# Patient Record
Sex: Male | Born: 1990 | Marital: Single | Smoking: Never smoker
Health system: Southern US, Community
[De-identification: ages and names within clinical notes are randomized; demographics above are authoritative.]

---
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
## 2008-06-02 ENCOUNTER — Ambulatory Visit: Payer: Self-pay | Admitting: Internal Medicine

## 2008-06-03 ENCOUNTER — Encounter (INDEPENDENT_AMBULATORY_CARE_PROVIDER_SITE_OTHER): Payer: Self-pay | Admitting: *Deleted

## 2008-07-28 ENCOUNTER — Ambulatory Visit: Payer: Self-pay | Admitting: Internal Medicine

## 2008-07-28 DIAGNOSIS — M25579 Pain in unspecified ankle and joints of unspecified foot: Secondary | ICD-10-CM | POA: Insufficient documentation

## 2008-07-29 ENCOUNTER — Telehealth (INDEPENDENT_AMBULATORY_CARE_PROVIDER_SITE_OTHER): Payer: Self-pay | Admitting: *Deleted

## 2008-07-29 LAB — CONVERTED CEMR LAB
ALT: 39 units/L (ref 0–53)
AST: 22 units/L (ref 0–37)
Alkaline Phosphatase: 53 units/L (ref 39–117)
Basophils Absolute: 0.1 10*3/uL (ref 0.0–0.1)
Calcium: 9.8 mg/dL (ref 8.4–10.5)
Chloride: 103 meq/L (ref 96–112)
Creatinine, Ser: 1 mg/dL (ref 0.4–1.5)
GFR calc non Af Amer: 103 mL/min
HCT: 42 % (ref 39.0–52.0)
Lymphocytes Relative: 23.6 % (ref 12.0–46.0)
MCHC: 34.7 g/dL (ref 30.0–36.0)
Monocytes Absolute: 0.8 10*3/uL (ref 0.1–1.0)
Monocytes Relative: 7.1 % (ref 3.0–12.0)
Neutrophils Relative %: 67 % (ref 43.0–77.0)
Platelets: 272 10*3/uL (ref 150–400)
Potassium: 3.8 meq/L (ref 3.5–5.1)
RDW: 12.6 % (ref 11.5–14.6)
Sodium: 142 meq/L (ref 135–145)
Total Protein: 8.1 g/dL (ref 6.0–8.3)
Uric Acid, Serum: 9.9 mg/dL — ABNORMAL HIGH (ref 4.0–7.8)

## 2011-03-09 NOTE — Assessment & Plan Note (Signed)
[facility]                          [place] OFFICE NOTE   NAME:Trevino, Jacob                       MRN:          [id number]  DATE:06/18/2007                            DOB:          1991/01/24    HISTORY AND PHYSICAL:   CHIEF COMPLAINT:  Rash.   HISTORY OF PRESENT ILLNESS:  Jacob Trevino is a 20 year old boy, I believe  originally from [place], who came here because of rash.  The rash is at his  face, neck, chest and back.  It is itchy at times.  He has used some over-  the-counter medication without any help.   PAST MEDICAL HISTORY:  Negative.   FAMILY HISTORY:  The patient and his mother denied any history of diabetes,  heart attacks, or cancer.   SOCIAL HISTORY:  He is a high Ec[employer] doing very well. Does not smoke  or drink.   REVIEW OF SYSTEMS:  He also states that from time to time he has pain in the  chest.  He points to the left pectoral area.  The pain is random, non-  exertional, lasts a few seconds, is not associated with any other symptoms,  and is somehow different when he is stretched.   MEDICATIONS:  None.   ALLERGIES:  No known drug allergies.   PHYSICAL EXAMINATION:  GENERAL:  He is alert, oriented in no apparent  distress.  VITAL SIGNS:  He is 5 feet 8 1/4 inch tall.  He weighs 193 pounds.  Blood  pressure 108/76.  Pulse 68.  SKIN:  The rash is more noticeable today at the anterior neck, upper chest  and upper back.  There are several lesions, most of them are pink to skin  color, papular ones.  He does have some classic white dots and black dots in  the face.  He states that many of the lesions in the back started that way  as white dots in the back and chest.   PROBLEM:  1. Jacob Trevino seems to have acne in the back and chest.  I went ahead and      prescribed metronidazole lotion, to be mixed with benzoyl peroxide to      use twice a day in all of the affected areas.  2. The patient has an atypical  chest pain.  I ask him to come back for a      followup in four weeks      and at that time we will do a more complete physical exam and follow up      on the rash.  The patient and the mom agreed to that plan.                                   Willow Ora, MD   JP/MedQ  DD:  06/17/2006  DT:  06/17/2006  Job #:  [id number]

## 2016-12-26 ENCOUNTER — Encounter: Payer: Self-pay | Admitting: Podiatry

## 2016-12-26 ENCOUNTER — Ambulatory Visit (INDEPENDENT_AMBULATORY_CARE_PROVIDER_SITE_OTHER): Payer: BLUE CROSS/BLUE SHIELD | Admitting: Podiatry

## 2016-12-26 VITALS — BP 131/87 | HR 71 | Ht 69.0 in | Wt 233.0 lb

## 2016-12-26 DIAGNOSIS — M65979 Unspecified synovitis and tenosynovitis, unspecified ankle and foot: Secondary | ICD-10-CM

## 2016-12-26 DIAGNOSIS — M659 Synovitis and tenosynovitis, unspecified: Secondary | ICD-10-CM | POA: Diagnosis not present

## 2016-12-26 DIAGNOSIS — M21969 Unspecified acquired deformity of unspecified lower leg: Secondary | ICD-10-CM | POA: Diagnosis not present

## 2016-12-26 DIAGNOSIS — M216X9 Other acquired deformities of unspecified foot: Secondary | ICD-10-CM

## 2016-12-26 DIAGNOSIS — M1000 Idiopathic gout, unspecified site: Secondary | ICD-10-CM

## 2016-12-26 NOTE — Progress Notes (Signed)
SUBJECTIVE: 26 y.o. year old male presents accompanied by his mother stating that he hurt his right foot after doing exercise while he was in [place]. Pain is at lateral ankle at this time. Initial injury was at the lateral column of left foot. He has noted a new tender spot with redness above right lateral ankle. He was [place] a doctor while in [place]. Was taking Vorem (Difluconac), Tagamet 200gm, Solaxin that were prescribed by [facility] doctor,  Had had high Uric Acid with Tophus formation. He used to take Urinom to lower blood uric acid.   REVIEW OF SYSTEMS: A comprehensive review of systems was negative except for: Gout.  OBJECTIVE: DERMATOLOGIC EXAMINATION: Mild erythema without edema over skin surface proximal to lateral malleoli with mild tenderness at certain spot with pressure.  VASCULAR EXAMINATION OF LOWER LIMBS: All pedal pulses are palpable with normal pulsation.  Capillary Filling times within 3 seconds in all digits.  No edema or erythema noted at affected area. Temperature gradient from tibial crest to dorsum of foot is within normal bilateral.  NEUROLOGIC EXAMINATION OF THE LOWER LIMBS: All epicritic and tactile sensations grossly intact. Sharp and Dull discriminatory sensations at the plantar ball of hallux is intact bilateral.   MUSCULOSKELETAL EXAMINATION: Positive of firm palpable mass at dorsum over 5th MPJ area extending to 4th metatarsal head area, about 3 x 4 cm.  Positive for hypermobile first ray bilateral. STJ hyperpronation bilateral.  RADIOGRAPHIC STUDIES:  General overview: Increased radio opacity surrounding 5th MPJ area of left foot.  Lateral view:  Significant elevation of the first ray bilateral. No acute changes seen at affected right lateral column.   ASSESSMENT: Hypermobile first ray bilateral. Abnormal weight shifting to lateral column. R/O Insect bite with cellular reaction superior to lateral malleoli. 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  PLAN: Reviewed findings and available treatment options. Discussed continuing medication to lower uric acid. May benefit from Custom orthotics. May remove left foot mass if requested. Return as needed.

## 2016-12-26 NOTE — Patient Instructions (Signed)
Seen for pain in right foot. Noted of excess first ray motion with lateral weight shifting. May have had lesser metatarsalgia from excess weight shifting during exercise. May continue with previous prescription if needed. New area of redness and tenderness over lateral ankle on right foot may have caused by an insect bite and resulted in cellular reaction. Use Cortisone cream if needed to reduce swelling or redness. Also noted of possible Tophus mass over 5th metatarsal head area left foot.  Return if need to have surgically excised. Discussed the need for custom orthotics. Will call with insurance coverage info.

## 2016-12-27 ENCOUNTER — Encounter: Payer: Self-pay | Admitting: Podiatry

## 2017-01-03 ENCOUNTER — Ambulatory Visit (INDEPENDENT_AMBULATORY_CARE_PROVIDER_SITE_OTHER): Payer: BLUE CROSS/BLUE SHIELD | Admitting: Podiatry

## 2017-01-03 DIAGNOSIS — M65979 Unspecified synovitis and tenosynovitis, unspecified ankle and foot: Secondary | ICD-10-CM

## 2017-01-03 DIAGNOSIS — M21969 Unspecified acquired deformity of unspecified lower leg: Secondary | ICD-10-CM

## 2017-01-03 DIAGNOSIS — M25579 Pain in unspecified ankle and joints of unspecified foot: Secondary | ICD-10-CM | POA: Diagnosis not present

## 2017-01-03 DIAGNOSIS — M659 Synovitis and tenosynovitis, unspecified: Secondary | ICD-10-CM | POA: Diagnosis not present

## 2017-01-03 DIAGNOSIS — M216X1 Other acquired deformities of right foot: Secondary | ICD-10-CM

## 2017-01-03 DIAGNOSIS — R2242 Localized swelling, mass and lump, left lower limb: Secondary | ICD-10-CM | POA: Diagnosis not present

## 2017-01-03 DIAGNOSIS — M25872 Other specified joint disorders, left ankle and foot: Secondary | ICD-10-CM

## 2017-01-03 DIAGNOSIS — M216X2 Other acquired deformities of left foot: Secondary | ICD-10-CM | POA: Diagnosis not present

## 2017-01-03 NOTE — Patient Instructions (Signed)
Both feet casted for Orthotics. As per request, surgical excision of left foot lesion approximately 3x5 cm mass under skin reviewed and signed. Will discuss further on surgery date.

## 2017-01-03 NOTE — Progress Notes (Signed)
Duration of 3 weeks since original injury.   SUBJECTIVE: 26 y.o. year old male presents accompanied by his mother requesting for custom orthotics and pre op consult for excision of the left foot lesion.  He still has some tenderness and swelling over the right foot at injured site.  HPI: Injury on right foot and was treated by a doctor while visiting [place]. Was taking Vorem (Difluconac), Tagamet 200gm, Solaxin that were prescribed by [id number] doctor.  Had had high Uric Acid with Tophus formation. He used to take Urinom to lower blood uric acid.   OBJECTIVE: DERMATOLOGIC EXAMINATION: No edema or erythema noted on right foot.  VASCULAR EXAMINATION OF LOWER LIMBS: All pedal pulses are palpable with normal pulsation.  Capillary Filling times within 3 seconds in all digits.  No edema or erythema noted at affected area. Temperature gradient from tibial crest to dorsum of foot is within normal bilateral.  NEUROLOGIC EXAMINATION OF THE LOWER LIMBS: All epicritic and tactile sensations grossly intact. Sharp and Dull discriminatory sensations at the plantar ball of hallux is intact bilateral.   MUSCULOSKELETAL EXAMINATION: Positive of firm palpable mass at dorsum over 5th MPJ area extending to 4th metatarsal head area, about 3 x 4 cm.  Positive for hypermobile first ray bilateral. STJ hyperpronation bilateral.  ASSESSMENT: Hypermobile first ray bilateral. Abnormal weight shifting to lateral column. R/O Insect bite with cellular reaction superior to lateral malleoli. Frequent history of Gout with Tophus formation left forefoot.  PLAN: Reviewed findings and available treatment options. Both feet casted for Orthotics. Pre op consult done for excision of benign mass over left foot near 4th and 5th MPJ.

## 2017-01-04 ENCOUNTER — Encounter: Payer: Self-pay | Admitting: Podiatry

## 2017-01-24 ENCOUNTER — Other Ambulatory Visit: Payer: Self-pay | Admitting: Podiatry

## 2017-01-24 MED ORDER — HYDROCODONE-IBUPROFEN 7.5-200 MG PO TABS: 1.0000 | ORAL_TABLET | Freq: Four times a day (QID) | ORAL | 0 refills | Status: DC | PRN

## 2017-01-25 DIAGNOSIS — L929 Granulomatous disorder of the skin and subcutaneous tissue, unspecified: Secondary | ICD-10-CM | POA: Diagnosis not present

## 2017-01-25 DIAGNOSIS — D2370 Other benign neoplasm of skin of unspecified lower limb, including hip: Secondary | ICD-10-CM | POA: Diagnosis not present

## 2017-01-25 DIAGNOSIS — D492 Neoplasm of unspecified behavior of bone, soft tissue, and skin: Secondary | ICD-10-CM | POA: Diagnosis not present

## 2017-01-25 HISTORY — PX: PRE-MALIGNANT / BENIGN SKIN LESION EXCISION: SHX160

## 2017-01-28 ENCOUNTER — Encounter: Payer: Self-pay | Admitting: *Deleted

## 2017-01-28 ENCOUNTER — Ambulatory Visit (INDEPENDENT_AMBULATORY_CARE_PROVIDER_SITE_OTHER): Payer: BLUE CROSS/BLUE SHIELD | Admitting: Podiatry

## 2017-01-28 DIAGNOSIS — M109 Gout, unspecified: Secondary | ICD-10-CM | POA: Insufficient documentation

## 2017-01-28 DIAGNOSIS — Z9889 Other specified postprocedural states: Secondary | ICD-10-CM

## 2017-01-28 MED ORDER — INDOMETHACIN 50 MG PO CAPS: 50.0000 mg | ORAL_CAPSULE | Freq: Three times a day (TID) | ORAL | 1 refills | Status: DC

## 2017-01-28 MED ORDER — AMOXICILLIN-POT CLAVULANATE 875-125 MG PO TABS: 1.0000 | ORAL_TABLET | Freq: Two times a day (BID) | ORAL | 0 refills | Status: DC

## 2017-01-28 NOTE — Patient Instructions (Signed)
Post op visit to remove drain. Noted of excess swelling with active drainage. Take antibiotics and Indocin as prescribed. Return this Thursday. If swelling and pain is increasing, return sooner.

## 2017-01-28 NOTE — Progress Notes (Signed)
3 days post op visit to remove drainage placed at the surgical wound (01/25/17). Patient stated that he was having excess pain. Wound had bled through surgical bandage. Left foot is warm with mild erythema. Wound has serous drainage from the drainage opening. Residual fluid pressed out. Cleansed with Iodine. Compression bandage applied.

## 2017-01-29 ENCOUNTER — Ambulatory Visit (INDEPENDENT_AMBULATORY_CARE_PROVIDER_SITE_OTHER): Payer: BLUE CROSS/BLUE SHIELD | Admitting: Podiatry

## 2017-01-29 DIAGNOSIS — Z9889 Other specified postprocedural states: Secondary | ICD-10-CM

## 2017-01-30 ENCOUNTER — Encounter: Payer: Self-pay | Admitting: Podiatry

## 2017-01-30 NOTE — Patient Instructions (Signed)
Post surgical wound healing is normal with subsiding edema and erythema. Surgical incision area flushed with antibiotic solution. Return in 3 days.

## 2017-01-30 NOTE — Progress Notes (Signed)
4 days post op visit. 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 Patient was asked to return today to have the surgical wound irrigated with antibiotic solution, Gentamycin and normal saline. Stated that the foot feels much better.   Objective: Reduced edema and erythema noted. No drainage from the wound noted. Wound is well closed.  Assessment: Subsiding edema and inflammation with good wound healing.  Plan: Surgical site cleansed and surface incision site flushed well with Antibiotic solution. Wet to dry dressing applied. Continue antibiotics (Augmentin 875/125) and Return in 3 days.

## 2017-01-31 ENCOUNTER — Encounter: Payer: BLUE CROSS/BLUE SHIELD | Admitting: Podiatry

## 2017-02-05 ENCOUNTER — Ambulatory Visit (INDEPENDENT_AMBULATORY_CARE_PROVIDER_SITE_OTHER): Payer: BLUE CROSS/BLUE SHIELD | Admitting: Podiatry

## 2017-02-05 ENCOUNTER — Encounter: Payer: Self-pay | Admitting: Podiatry

## 2017-02-05 DIAGNOSIS — Z9889 Other specified postprocedural states: Secondary | ICD-10-CM

## 2017-02-05 NOTE — Patient Instructions (Signed)
10 Days post op wound healing normal. Sutures removed and Mefix tape placed with instruction. Return next Wednesday.

## 2017-02-05 NOTE — Progress Notes (Signed)
10 days post op visit. 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 Been back to Gout medication. Denies any problem other than back of left heel hurts from leaning back.   Objective: Clean dry surgical site. Wound is well closed.  Assessment: Normal wound healing in progress.   Plan: Sutures removed. Return in one week.

## 2017-02-13 ENCOUNTER — Encounter: Payer: Self-pay | Admitting: Podiatry

## 2017-02-13 ENCOUNTER — Ambulatory Visit (INDEPENDENT_AMBULATORY_CARE_PROVIDER_SITE_OTHER): Payer: BLUE CROSS/BLUE SHIELD | Admitting: Podiatry

## 2017-02-13 DIAGNOSIS — Z9889 Other specified postprocedural states: Secondary | ICD-10-CM

## 2017-02-13 NOTE — Progress Notes (Signed)
19days post op visit.  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 Been back to Gout medication. Had developed mild rash over the left foot possible from compression bandage.  Objective: Good wound closure without edema or erythema.  Assessment: Normal wound healing in progress.   Plan: Continue with Mefix tape to reinforce the incision site for the next couple of weeks. Home care supply dispensed. Return as needed.

## 2017-02-13 NOTE — Patient Instructions (Addendum)
19 days post op follow up left foot. Wound healing normal. Continue with Mefix tape for the next 2 weeks. Return as needed.

## 2017-04-01 ENCOUNTER — Encounter: Payer: Self-pay | Admitting: *Deleted

## 2017-07-12 ENCOUNTER — Ambulatory Visit (INDEPENDENT_AMBULATORY_CARE_PROVIDER_SITE_OTHER): Payer: BLUE CROSS/BLUE SHIELD | Admitting: Family Medicine

## 2017-07-12 ENCOUNTER — Encounter: Payer: Self-pay | Admitting: Family Medicine

## 2017-07-12 VITALS — BP 129/81 | HR 57 | Temp 98.2°F | Resp 18 | Ht 69.0 in | Wt 235.8 lb

## 2017-07-12 DIAGNOSIS — M25471 Effusion, right ankle: Secondary | ICD-10-CM | POA: Diagnosis not present

## 2017-07-12 DIAGNOSIS — M25571 Pain in right ankle and joints of right foot: Secondary | ICD-10-CM | POA: Diagnosis not present

## 2017-07-12 DIAGNOSIS — M79604 Pain in right leg: Secondary | ICD-10-CM | POA: Diagnosis not present

## 2017-07-12 DIAGNOSIS — S93401A Sprain of unspecified ligament of right ankle, initial encounter: Secondary | ICD-10-CM

## 2017-07-12 DIAGNOSIS — Z8739 Personal history of other diseases of the musculoskeletal system and connective tissue: Secondary | ICD-10-CM | POA: Diagnosis not present

## 2017-07-12 MED ORDER — MELOXICAM 7.5 MG PO TABS: 7.5000 mg | ORAL_TABLET | Freq: Every day | ORAL | 0 refills | Status: DC

## 2017-07-12 NOTE — Progress Notes (Signed)
Subjective:    Patient ID: Jacob Trevino, male    DOB: [birth year], 26 y.o.   MRN: [id number]  HPI Jacob Trevino is a 26 y.o. male Presents today for: Chief Complaint  Patient presents with  . 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  Today presents with right leg pain. Started with popping in R ankle 4 days ago.  Has continued to try to pop it last few days, and feels like more sore. Had a misstep 2 days ago when picking up sticks. Ankle turned in at that time. Took indomethacin a few times. Was able to weight bear, walking ok. Some more soreness with walking by end of day yesterday.tried ibuprofen - min relief.  Noticed more pain and swelling last night. Bandaged last night, but soreness worsened. More radiating pain last night - radiated up leg to R buttock area - affected sleep at 4am. Tried herbal oil like Tiger balm this am. Min relief. Tried hydrocodone (leftover from prior surgery) about 5 am - helped some.  Less soreness today. Able to walk and scribe earlier today. Had been using inserts in shoes, but taken out today. Less radiating pain up leg this am.   No bowel or bladder incontinence, no saddle anesthesia, no lower extremity weakness.    Last gout flare unknown. Current pain feels different than prior gout pain.   Tx.: ace bandage and elevation last night.    Patient Active Problem List   Diagnosis Date Noted  . Gout 01/28/2017  . ANKLE PAIN, LEFT 07/28/2008   History reviewed. No pertinent past medical history. Past Surgical History:  Procedure Laterality Date  . PRE-MALIGNANT / BENIGN SKIN LESION EXCISION Left 01/25/2017   Left Foot   No Known Allergies Prior to Admission medications   Medication Sig Start Date End Date Taking? Authorizing Provider  HYDROcodone-ibuprofen (VICOPROFEN) 7.5-200 MG tablet Take 1 tablet by mouth every 6 (six) hours as needed for moderate pain. 01/24/17  Yes  Sheard, Myeong O, DPM  indomethacin (INDOCIN) 50 MG capsule Take 1 capsule (50 mg total) by mouth 3 (three) times daily with meals. Take three times a day till pain and swelling stops. Then take twice a day for 3 more days. 01/28/17  Yes Sheard, Briscoe Burns, DPM   Social History   Social History  . Marital status: Single    Spouse name: N/A  . Number of children: N/A  . Years of education: N/A   Occupational History  . Not on file.   Social History Main Topics  . Smoking status: Never Smoker  . Smokeless tobacco: Never Used  . Alcohol use Not on file  . Drug use: Unknown  . Sexual activity: Not on file   Other Topics Concern  . Not on file   Social History Narrative  . No narrative on file    Review of Systems  Constitutional: Negative for chills and fever.  Musculoskeletal: Positive for arthralgias and joint swelling. Negative for back pain (minimal lower yesterday. ).  Skin: Negative for rash.  other per HPI.      Objective:   Physical Exam  Constitutional: He is oriented to person, place, and time. He appears well-developed and well-nourished. No distress.  Cardiovascular: Normal rate.   Pulmonary/Chest: Effort normal.  Musculoskeletal:       Right ankle: He exhibits normal range of motion, no ecchymosis and normal pulse. Tenderness (  Soft tissue lateral ankle, and slightly tender at retrocalcaneal bursa. No focal bony tenderness.). AITFL tenderness found. No lateral malleolus, no medial malleolus, no CF ligament, no posterior TFL, no head of 5th metatarsal and no proximal fibula tenderness found. Achilles tendon normal.       Lumbar back: He exhibits no tenderness and no bony tenderness.       Right foot: There is normal range of motion, no tenderness and no bony tenderness.  Negative seated straight leg raise.  Neurological: He is alert and oriented to person, place, and time.  NVI distally  Skin: Skin is warm and dry. No rash noted. No erythema.  Psychiatric: He has a  normal mood and affect. His behavior is normal.   Vitals:   07/12/17 1244  BP: 129/81  Pulse: (!) 57  Resp: 18  Temp: 98.2 F (36.8 C)  TempSrc: Oral  SpO2: 97%  Weight: 235 lb 12.8 oz (107 kg)  Height: 5\' 9"  (1.753 m)      Assessment & Plan:    [name] is a 26 y.o. male Right ankle swelling - Plan: meloxicam (MOBIC) 7.5 MG tablet, Apply ASO ankle  Acute right ankle pain - Plan: meloxicam (MOBIC) 7.5 MG tablet, Apply ASO ankle  Right leg pain - Plan: meloxicam (MOBIC) 7.5 MG tablet  History of gout - Plan: meloxicam (MOBIC) 7.5 MG tablet  Sprain of right ankle, unspecified ligament, initial encounter  History of gout, possible mild gout flare with ankle pain/slight swelling. However also has history of sprain. Walking ankle, may have changed position, gait, progression of leg pain versus early sciatica. Overall reassuring exam without apparent need for x-ray at present.  -Start meloxicam 7.5 mg daily, ASO brace placed on ankle, handout given. Relative rest, elevation, RTC precautions given  Meds ordered this encounter  Medications  . meloxicam (MOBIC) 7.5 MG tablet    Sig: Take 1 tablet (7.5 mg total) by mouth daily.    Dispense:  30 tablet    Refill:  0   Patient Instructions    Try either the indomethacin up to 3 times per day with food OR the meloxicam once per day. Do not combine those medications. ankle brace as needed, ice and elevation, see other information on ankle sprains below. Gentle range of motion initially. I think you may have a combination of possible swelling from gout versus just an ankle sprain. Leg pain may be related to walking differently.  I expect that to continue to improve as meloxicam or indomethacin should also help leg pain. If those areas are not improving into next week, follow up with me we can decide if x-rays are needed   Return to the clinic or go to the nearest emergency room if any of your symptoms worsen or new symptoms  occur.  Ankle Sprain An ankle sprain is a stretch or tear in one of the tough, fiber-like tissues (ligaments) in the ankle. The ligaments in your ankle help to hold the bones of the ankle together. What are the causes? This condition is often caused by stepping on or falling on the outer edge of the foot. What increases the risk? This condition is more likely to develop in people who play sports. What are the signs or symptoms? Symptoms of this condition include:  Pain in your ankle.  Swelling.  Bruising. Bruising may develop right after you sprain your ankle or 1-2 days later.  Trouble standing or walking, especially when you turn or change directions.  How is this diagnosed? This condition is diagnosed with a physical exam. During the exam, your health care provider will press on certain parts of your foot and ankle and try to move them in certain ways. X-rays may be taken to see how severe the sprain is and to check for broken bones. How is this treated? This condition may be treated with:  A brace. This is used to keep the ankle from moving until it heals.  An elastic bandage. This is used to support the ankle.  Crutches.  Pain medicine.  Surgery. This may be needed if the sprain is severe.  Physical therapy. This may help to improve the range of motion in the ankle.  Follow these instructions at home:  Rest your ankle.  Take over-the-counter and prescription medicines only as told by your health care provider.  For 2-3 days, keep your ankle raised (elevated) above the level of your heart as much as possible.  If directed, apply ice to the area: ? Put ice in a plastic bag. ? Place a towel between your skin and the bag. ? Leave the ice on for 20 minutes, 2-3 times a day.  If you were given a brace: ? Wear it as directed. ? Remove it to shower or bathe. ? Try not to move your ankle much, but wiggle your toes from time to time. This helps to prevent  swelling.  If you were given an elastic bandage (dressing): ? Remove it to shower or bathe. ? Try not to move your ankle much, but wiggle your toes from time to time. This helps to prevent swelling. ? Adjust the dressing to make it more comfortable if it feels too tight. ? Loosen the dressing if you have numbness or tingling in your foot, or if your foot becomes cold and blue.  If you have crutches, use them as told by your health care provider. Continue to use them until you can walk without feeling pain in your ankle. Contact a health care provider if:  You have rapidly increasing bruising or swelling.  Your pain is not relieved with medicine. Get help right away if:  Your toes or foot becomes numb or blue.  You have severe pain that gets worse. This information is not intended to replace advice given to you by your health care provider. Make sure you discuss any questions you have with your health care provider. Document Released: 10/08/2005 Document Revised: 02/15/2016 Document Reviewed: 05/10/2015 Elsevier Interactive Patient Education  2017 [employer].   IF you received an x-ray today, you will receive an invoice from [facility]. Please contact [facility] at [phone] with questions or concerns regarding your invoice.   IF you received labwork today, you will receive an invoice from [place]. Please contact [facility] at [phone] with questions or concerns regarding your invoice.   Our billing staff will not be able to assist you with questions regarding bills from these companies.  You will be contacted with the lab results as soon as they are available. The fastest way to get your results is to activate your My Chart account. Instructions are located on the last page of this paperwork. If you have not heard from [place] regarding the results in 2 weeks, please contact this office.      Signed,   Merri Ray, MD [facility].  07/14/17 1:10 PM

## 2017-07-12 NOTE — Patient Instructions (Addendum)
Try either the indomethacin up to 3 times per day with food OR the meloxicam once per day. Do not combine those medications. ankle brace as needed, ice and elevation, see other information on ankle sprains below. Gentle range of motion initially. I think you may have a combination of possible swelling from gout versus just an ankle sprain. Leg pain may be related to walking differently.  I expect that to continue to improve as meloxicam or indomethacin should also help leg pain. If those areas are not improving into next week, follow up with me we can decide if x-rays are needed   Return to the clinic or go to the nearest emergency room if any of your symptoms worsen or new symptoms occur.  Ankle Sprain An ankle sprain is a stretch or tear in one of the tough, fiber-like tissues (ligaments) in the ankle. The ligaments in your ankle help to hold the bones of the ankle together. What are the causes? This condition is often caused by stepping on or falling on the outer edge of the foot. What increases the risk? This condition is more likely to develop in people who play sports. What are the signs or symptoms? Symptoms of this condition include:  Pain in your ankle.  Swelling.  Bruising. Bruising may develop right after you sprain your ankle or 1-2 days later.  Trouble standing or walking, especially when you turn or change directions.  How is this diagnosed? This condition is diagnosed with a physical exam. During the exam, your health care provider will press on certain parts of your foot and ankle and try to move them in certain ways. X-rays may be taken to see how severe the sprain is and to check for broken bones. How is this treated? This condition may be treated with:  A brace. This is used to keep the ankle from moving until it heals.  An elastic bandage. This is used to support the ankle.  Crutches.  Pain medicine.  Surgery. This may be needed if the sprain is  severe.  Physical therapy. This may help to improve the range of motion in the ankle.  Follow these instructions at home:  Rest your ankle.  Take over-the-counter and prescription medicines only as told by your health care provider.  For 2-3 days, keep your ankle raised (elevated) above the level of your heart as much as possible.  If directed, apply ice to the area: ? Put ice in a plastic bag. ? Place a towel between your skin and the bag. ? Leave the ice on for 20 minutes, 2-3 times a day.  If you were given a brace: ? Wear it as directed. ? Remove it to shower or bathe. ? Try not to move your ankle much, but wiggle your toes from time to time. This helps to prevent swelling.  If you were given an elastic bandage (dressing): ? Remove it to shower or bathe. ? Try not to move your ankle much, but wiggle your toes from time to time. This helps to prevent swelling. ? Adjust the dressing to make it more comfortable if it feels too tight. ? Loosen the dressing if you have numbness or tingling in your foot, or if your foot becomes cold and blue.  If you have crutches, use them as told by your health care provider. Continue to use them until you can walk without feeling pain in your ankle. Contact a health care provider if:  You have rapidly increasing bruising  or swelling.  Your pain is not relieved with medicine. Get help right away if:  Your toes or foot becomes numb or blue.  You have severe pain that gets worse. This information is not intended to replace advice given to you by your health care provider. Make sure you discuss any questions you have with your health care provider. Document Released: 10/08/2005 Document Revised: 02/15/2016 Document Reviewed: 05/10/2015 Elsevier Interactive Patient Education  2017 [employer].   IF you received an x-ray today, you will receive an invoice from [facility]. Please contact [facility] at [phone] with  questions or concerns regarding your invoice.   IF you received labwork today, you will receive an invoice from [facility]. Please contact [facility] at [phone] with questions or concerns regarding your invoice.   Our billing staff will not be able to assist you with questions regarding bills from these companies.  You will be contacted with the lab results as soon as they are available. The fastest way to get your results is to activate your My Chart account. Instructions are located on the last page of this paperwork. If you have not heard from [place] regarding the results in 2 weeks, please contact this office.

## 2017-08-08 ENCOUNTER — Other Ambulatory Visit: Payer: Self-pay | Admitting: Family Medicine

## 2017-08-08 DIAGNOSIS — M79604 Pain in right leg: Secondary | ICD-10-CM

## 2017-08-08 DIAGNOSIS — M25571 Pain in right ankle and joints of right foot: Secondary | ICD-10-CM

## 2017-08-08 DIAGNOSIS — M25471 Effusion, right ankle: Secondary | ICD-10-CM

## 2017-08-08 DIAGNOSIS — Z8739 Personal history of other diseases of the musculoskeletal system and connective tissue: Secondary | ICD-10-CM

## 2017-11-13 ENCOUNTER — Ambulatory Visit (INDEPENDENT_AMBULATORY_CARE_PROVIDER_SITE_OTHER): Payer: BLUE CROSS/BLUE SHIELD | Admitting: Family Medicine

## 2017-11-13 ENCOUNTER — Encounter: Payer: Self-pay | Admitting: Family Medicine

## 2017-11-13 ENCOUNTER — Ambulatory Visit (INDEPENDENT_AMBULATORY_CARE_PROVIDER_SITE_OTHER): Payer: BLUE CROSS/BLUE SHIELD

## 2017-11-13 VITALS — BP 144/93 | HR 65 | Temp 97.4°F | Resp 18 | Ht 69.0 in | Wt 243.2 lb

## 2017-11-13 DIAGNOSIS — M25522 Pain in left elbow: Secondary | ICD-10-CM

## 2017-11-13 DIAGNOSIS — M6588 Other synovitis and tenosynovitis, other site: Secondary | ICD-10-CM | POA: Diagnosis not present

## 2017-11-13 DIAGNOSIS — M65839 Other synovitis and tenosynovitis, unspecified forearm: Secondary | ICD-10-CM

## 2017-11-13 DIAGNOSIS — R030 Elevated blood-pressure reading, without diagnosis of hypertension: Secondary | ICD-10-CM

## 2017-11-13 DIAGNOSIS — Z8739 Personal history of other diseases of the musculoskeletal system and connective tissue: Secondary | ICD-10-CM

## 2017-11-13 DIAGNOSIS — M25532 Pain in left wrist: Secondary | ICD-10-CM | POA: Diagnosis not present

## 2017-11-13 LAB — POCT CBC
GRANULOCYTE PERCENT: 66.7 % (ref 37–80)
HCT, POC: 42.5 % — AB (ref 43.5–53.7)
Hemoglobin: 14.5 g/dL (ref 14.1–18.1)
Lymph, poc: 2.4 (ref 0.6–3.4)
MCH, POC: 30.7 pg (ref 27–31.2)
MCHC: 34.1 g/dL (ref 31.8–35.4)
MCV: 89.9 fL (ref 80–97)
MID (CBC): 0.4 (ref 0–0.9)
MPV: 7 fL (ref 0–99.8)
PLATELET COUNT, POC: 262 10*3/uL (ref 142–424)
POC Granulocyte: 5.6 (ref 2–6.9)
POC LYMPH %: 28.7 % (ref 10–50)
POC MID %: 4.6 %M (ref 0–12)
RBC: 4.73 M/uL (ref 4.69–6.13)
RDW, POC: 12.8 %
WBC: 8.4 10*3/uL (ref 4.6–10.2)

## 2017-11-13 LAB — POCT SEDIMENTATION RATE: POCT SED RATE: 30 mm/h — AB (ref 0–22)

## 2017-11-13 MED ORDER — MELOXICAM 7.5 MG PO TABS: 7.5000 mg | ORAL_TABLET | Freq: Every day | ORAL | 0 refills | Status: DC

## 2017-11-13 MED ORDER — PREDNISONE 20 MG PO TABS: ORAL_TABLET | ORAL | 0 refills | Status: DC

## 2017-11-13 NOTE — Progress Notes (Signed)
Subjective:    Patient ID: Jacob Trevino, male    DOB: [birth year], 27 y.o.   MRN: [id number] Chief Complaint  Patient presents with  . 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  However, 2d prior, he woke from sleep with left wrist pain.  He does have a h/o gout (and has been watching his purine food intake closely w/o changes as well as taking supplements to help lower his uric acid levels. He has never had a gout flair in the upper ext.)  He tried the normal things that he does for gout such as holding the joint under warm water which made his pain sig worse. Icing helped minimally. He tried meloxicam w/o any relief. The pain has proceeded to worsen and is now severely restricting his motion in his hand and fingers as well. Can't really use his left forearm distally at all.  No numbness or tingling. Pain kept him from sleep last night - he had to hold his left arm above his head or adducted across his chest to even make it tolerable.  No rashes, no erythema.  Slight warmth to the area but not the head from gout before.  Not hyperesthetic to light touch.  Slight swelling. No f/c. No systemic sxs. Never h/o any wrist pain/injuries prior. No injury provoking.  Unrelated - for a long time he has had sensitivity of his left olecranon process - whenever he accidentally hits his Lt olecranon against something - it causes severe instantaneous sensitivity - sort of long the "funny bone"/ulnar nerve sensation except it is with the tip of the elbow, not the groove and does not take much pressure. Only lasts a second or two, no radiation. Never happens to right. Left very point of elbow feels a little sharper/more prominent to him than Rt.  No h/o injury.  Not changed over the yrs - just persisting.  No past medical history on file. Past Surgical History:    Procedure Laterality Date  . PRE-MALIGNANT / BENIGN SKIN LESION EXCISION Left 01/25/2017   Left Foot   No current outpatient medications on file prior to visit.   No current facility-administered medications on file prior to visit.    No Known Allergies No family history on file. Social History   Socioeconomic History  . Marital status: Single    Spouse name: None  . Number of children: None  . Years of education: None  . Highest education level: None  Social Needs  . Financial resource strain: None  . Food insecurity - worry: None  . Food insecurity - inability: None  . Transportation needs - medical: None  . Transportation needs - non-medical: None  Occupational History  . None  Tobacco Use  . Smoking status: Never Smoker  . Smokeless tobacco: Never Used  Substance and Sexual Activity  . Alcohol use: None  . Drug use: None  . Sexual activity: None  Other Topics Concern  . None  Social History Narrative  . None   Depression screen [facility] 2/9 11/13/2017 07/12/2017  Decreased Interest 0 0  Down, Depressed, Hopeless 0 0  PHQ - 2 Score 0 0     Review of Systems See hpi    Objective:   Physical Exam  Constitutional: He is oriented to  person, place, and time. He appears well-developed and well-nourished. No distress.  HENT:  Head: Normocephalic and atraumatic.  Eyes: No scleral icterus.  Pulmonary/Chest: Effort normal.  Musculoskeletal:       Right elbow: He exhibits deformity (question of slight bony prominence of olecranon process in Lt >Rt). He exhibits normal range of motion, no swelling and no effusion. No tenderness found.       Right wrist: He exhibits decreased range of motion, tenderness, bony tenderness and swelling (mild over ulnar styloid). He exhibits no effusion, no crepitus and no deformity.       Left wrist: Normal.       Right forearm: Normal. He exhibits no tenderness and no bony tenderness.       Right hand: He exhibits decreased range of motion.  He exhibits no tenderness, no bony tenderness, normal capillary refill and no swelling. Normal sensation noted. Decreased strength noted. He exhibits wrist extension trouble.       Left hand: Normal.  Pain located on dorsum of wrist over ulnar styloid which is very ttp and radiates distally. Ulnar/lateral aspect of carpals very ttp. Mild tenderness over 4th and 5th metacarpals, none over MTPs/fingers. Non over flexor aspect of wrist.  Mild decrease in supination, moderate decrease in pronation.  Left wrist flexion moderately reduced, extension severely reduced. Grasp mildly reduced but slow/stiff and pain prohibits strength. Severe pain with wrist flex/ext resistance. No warmth, skin normal appearance/color, normal cap refill and pulses, mild swelling over ulnar aspect of dorsum of hand.  Neurological: He is alert and oriented to person, place, and time.  Skin: Skin is warm and dry. He is not diaphoretic.  Psychiatric: He has a normal mood and affect. His behavior is normal.    BP (!) 144/93   Pulse 65   Temp (!) 97.4 F (36.3 C) (Oral)   Resp 18   Ht 5\' 9"  (1.753 m)   Wt 243 lb 3.2 oz (110.3 kg)   SpO2 97%   BMI 35.91 kg/m   Dg Wrist Complete Left  Result Date: 11/13/2017 CLINICAL DATA:  Left wrist pain and reduced range of motion for the past 3 days. No known injury. Symptoms are centered over the ulnar aspect of the wrist. Patient has a history of gout. EXAM: LEFT WRIST - COMPLETE 3+ VIEW COMPARISON:  None in PACs FINDINGS: The bones are subjectively adequately mineralized. There no lytic or blastic lesion. There is mild soft tissue swelling over the distal ulna. No soft tissue calcifications are observed. The joint spaces are well maintained. There is no acute fracture or dislocation. IMPRESSION: There is no acute bony abnormality of the left wrist. No erosive changes are observed. There is mild soft tissue swelling over the distal ulna. Electronically Signed   By: David  Martinique M.D.    On: 11/13/2017 09:18   Results for orders placed or performed in visit on 11/13/17             POCT CBC  Result Value Ref Range   WBC 8.4 4.6 - 10.2 K/uL   Lymph, poc 2.4 0.6 - 3.4   POC LYMPH PERCENT 28.7 10 - 50 %L   MID (cbc) 0.4 0 - 0.9   POC MID % 4.6 0 - 12 %M   POC Granulocyte 5.6 2 - 6.9   Granulocyte percent 66.7 37 - 80 %G   RBC 4.73 4.69 - 6.13 M/uL   Hemoglobin 14.5 14.1 - 18.1 g/dL   HCT, POC 42.5 (A)  43.5 - 53.7 %   MCV 89.9 80 - 97 fL   MCH, POC 30.7 27 - 31.2 pg   MCHC 34.1 31.8 - 35.4 g/dL   RDW, POC 12.8 %   Platelet Count, POC 262 142 - 424 K/uL   MPV 7.0 0 - 99.8 fL  POCT SEDIMENTATION RATE  Result Value Ref Range   POCT SED RATE 30 (A) 0 - 22 mm/hr       Assessment & Plan:   1. Acute pain of left wrist   2. History of gout   3. Extensor carpi radialis longus tenosynovitis - placed in cock-up wrist splint. Start prednisone taper today - after complete course than can transition to meloxicam daily until pain resolved. RICE. Wear splint until pain resolved, then can remove brace at rest and sleep but wear during exacerbating activities until completely sx free. Recheck in 1 week if improving but RTC if no improvement or worsening - ok to f/u w/ myself or sports med Dr. Carlota Raspberry.  4. Left elbow pain - mentioned at end of visit and likely unrelated to current pain. Discussed xraying but has already been in an hr - will defer till f/u visit in 1 wk.  5. Elevated blood pressure reading - suspect due to severity of pain, all nml prior     Orders Placed This Encounter  Procedures  . DG Wrist Complete Left    Standing Status:   Future    Number of Occurrences:   1    Standing Expiration Date:   01/12/2019    Order Specific Question:   Reason for Exam (SYMPTOM  OR DIAGNOSIS REQUIRED)    Answer:   left wrist pain over ulner styloid and ulnar aspect of wrist with sig reduced AROM x 3d, NKI, no sig response to nsaids, h/o gout    Order Specific Question:    Preferred imaging location?    Answer:   External    Order Specific Question:   Radiology Contrast Protocol - do NOT remove file path    Answer:   [email or web address]  . Uric acid  . POCT CBC  . POCT SEDIMENTATION RATE    Meds ordered this encounter  Medications  . predniSONE (DELTASONE) 20 MG tablet    Sig: Take 3 tabs po qam x 2d, 2 tabs po qam x 2d, 1 tab po qam x 2d, then stop    Dispense:  12 tablet    Refill:  0  . meloxicam (MOBIC) 7.5 MG tablet    Sig: Take 1-2 tablets (7.5-15 mg total) by mouth daily. Starting after prednisone is complete    Dispense:  60 tablet    Refill:  0    Delman Cheadle, M.D.  Primary Care at [facility] [address] [place], [place] [address] [phone] phone [phone] fax  11/15/17 3:38 PM

## 2017-11-13 NOTE — Patient Instructions (Addendum)
Dg Wrist Complete Left  Result Date: 11/13/2017 CLINICAL DATA:  Left wrist pain and reduced range of motion for the past 3 days. No known injury. Symptoms are centered over the ulnar aspect of the wrist. Patient has a history of gout. EXAM: LEFT WRIST - COMPLETE 3+ VIEW COMPARISON:  None in PACs FINDINGS: The bones are subjectively adequately mineralized. There no lytic or blastic lesion. There is mild soft tissue swelling over the distal ulna. No soft tissue calcifications are observed. The joint spaces are well maintained. There is no acute fracture or dislocation. IMPRESSION: There is no acute bony abnormality of the left wrist. No erosive changes are observed. There is mild soft tissue swelling over the distal ulna. Electronically Signed   By: David  Martinique M.D.   On: 11/13/2017 09:18    Ice for 20 min 4 times a day at least.  Rest. Wear brace until pain is completely resolved, then can remove and wear only when you are doing activities that might exacerbate (like typing). Can restart meloxicam when the prednisone is complete. Recheck w/ sports med Carlota Raspberry) in 1 week.   IF you received an x-ray today, you will receive an invoice from [facility]. Please contact [facility] at [phone] with questions or concerns regarding your invoice.   IF you received labwork today, you will receive an invoice from [facility]. Please contact [facility] at [phone] with questions or concerns regarding your invoice.   Our billing staff will not be able to assist you with questions regarding bills from these companies.  You will be contacted with the lab results as soon as they are available. The fastest way to get your results is to activate your My Chart account. Instructions are located on the last page of this paperwork. If you have not heard from [place] regarding the results in 2 weeks, please contact this office.      Extensor Carpi Ulnaris Tendinitis Extensor carpi ulnaris tendinitis  is inflammation of the long, thin muscle that is located on the outer side of your forearm (extensor carpi ulnaris). It is a common sports-related injury. What are the causes? This condition is caused by:  Putting repeated stress on your wrist.  Using an improper technique while playing sports like golf and tennis.  Weakening of the tendon due to age or a health problem.  What increases the risk? This condition is more likely to develop in people who:  Play sports like golf, tennis, or rugby.  Are middle-aged or older.  Have an inflammatory condition, such asrheumatoid arthritis.  What are the signs or symptoms? Symptoms of this condition include:  Pain along your forearm when moving your wrist.  A constant ache on the pinkie side of your wrist.  Feeling like your grip is weaker than usual.  A popping or tearing feeling in your wrist.  Swelling.  How is this diagnosed? This condition is diagnosed based on your symptoms, your medical history, and the results of a physical exam. During your exam, your health care provider will check the strength of your grip and may ask you to bend your wrist. Your health care provider may also order an MRI or ultrasound to check for tears in your ligaments, muscles, or tendons. How is this treated?  Treatment for this condition includes:  Resting your arm.  Wearing a splint on your wrist.  Medicines to help with pain and swelling.  Applying heat or ice to the area to ease pain.  Physical therapy to strengthen  and restore range of motion in your wrist.  Follow these instructions at home: If you have a splint:  Wear it as told by your health care provider. Remove it only as told by your health care provider.  Loosen the splint if your fingers become numb and tingle, or if they turn cold and blue.  Do not let your splint get wet if it is not waterproof.  Keep the splint clean. Bathing  Do not take baths, swim, or use a hot tub  until your health care provider approves. Ask your health care provider if you can take showers. You may only be allowed to take sponge baths for bathing.  If your splint is not waterproof, cover it with a watertight plastic bag when you take a bath or a shower. Managing pain, stiffness, and swelling  If directed, apply ice to the injured area. ? Put ice in a plastic bag. ? Place a towel between your skin and the bag. ? Leave the ice on for 20 minutes, 2-3 times a day. Activity  Return to your normal activities as told by your health care provider. Ask your health care provider what activities are safe for you.  Avoid activities that put strain on your wrist for as long as told by your health care provider.  Do not use your injured hand to support your body weight until your health care provider says that you can.  Do exercises as told by your health care provider. General instructions  Do not use any tobacco products, including cigarettes, chewing tobacco, or e-cigarettes. Tobacco can delay bone healing. If you need help quitting, ask your health care provider.  Take over-the-counter and prescription medicines only as told by your health care provider.  Keep all follow-up visits as told by your health care provider. This is important. How is this prevented?  Warm up and stretch before being active.  Cool down and stretch after being active.  Give your body time to rest between periods of activity.  Make sure to use equipment that fits you.  If you play golf or racket sports, try to improve your technique or focus on proper form.  Maintain physical fitness, including: ? Strength. ? Flexibility. ? Endurance. Contact a health care provider if:  Your pain does not improve in 7--10 days.  Your pain gets worse. Get help right away if:  Your pain is severe.  You cannot move your wrist. This information is not intended to replace advice given to you by your health care  provider. Make sure you discuss any questions you have with your health care provider. Document Released: 10/08/2005 Document Revised: 06/14/2016 Document Reviewed: 06/24/2015 Elsevier Interactive Patient Education  2018 [employer] and [employer] Radialis Tendinitis What are the causes? These conditions may be caused by:  Repetitive motions or overuse (common).  Wear and tear. (common).  An injury.  Excessive exercise or strain.  Certain antibiotic medicines.  In some cases, the cause may not be known. What increases the risk? These conditions are more likely to develop in:  People who play sports that involve constantly flexing or stretching the wrist and forearm, such as volleyball and water polo.  Older adults.  People with have a job that involves flexing the wrist over and over, such as people who work as Chiropractor, Psychologist, prison and probation services, Personnel officer.  People with certain health conditions, such as: ? Rheumatoid arthritis. ? Gout. ? Diabetes.  What are the signs or  symptoms? Symptoms of these conditions may develop gradually. Symptoms include:  Pain or tenderness in the wrist.  Pain when flexing or stretching the wrist.  Pain when gripping or lifting with the palm of the hand.  Swelling.  How is this diagnosed? This condition may be diagnosed based on:  Your symptoms.  Your medical history.  A physical exam.  During the physical exam, you may be asked to move your hand, wrist, and arm in certain ways. In order to rule out another condition, your health care provider may order one or more of the following tests:  MRI to get detailed images of the body's soft tissues and detect tendon tears and inflammation.  Ultrasound to detect soft-tissue injuries, such as tears and inflammation of the ligaments or tendons.  How is this treated? Treatment for this condition may include:  Rest. You should limit activities that cause your symptoms to  get worse or flare up.  Heat and ice treatment. Both heat and cold can help to ease pain and may be applied to the wrist or forearm as needed to reduce pain and inflammation.  Splint. You may need to wear a splint to keep your wrist and forearm from moving (keep them immobilized) until your symptoms improve.  Medicine. Your health care provider may prescribe steroids or other anti-inflammatory medicines, like ibuprofen, to temporarily ease your pain and other symptoms.  Physical therapy. Your health care provider may ask you to do exercises to maintain mobility and range of motion in your wrist.  Follow these instructions at home: If you have a splint:  Wear it as told by your health care provider. Remove it only as told by your health care provider.  Loosen the splint if your fingers tingle, become numb, or turn cold and blue.  Do not let your splint get wet if it is not waterproof.  Keep the splint clean. Managing pain, stiffness, and swelling  If directed, apply ice to the injured area. ? Put ice in a plastic bag. ? Place a damp towel between your skin and the bag. ? Leave the ice on for 20 minutes, 2-3 times a day.  Move your fingers often to avoid stiffness and to lessen swelling.  Raise (elevate) the injured area above the level of your heart while you are sitting or lying down. Activity  Return to your normal activities as told by your health care provider. Ask your health care provider what activities are safe for you.  Do exercises as told by your health care provider. General instructions  Do not use any tobacco products, including cigarettes, chewing tobacco, or e-cigarettes. Tobacco can delay healing. If you need help quitting, ask your health care provider.  Take over-the-counter and prescription medicines only as told by your health care provider.  Keep all follow-up visits as told by your health care provider. This is important. How is this prevented?  Warm  up and stretch before being active.  Cool down and stretch after being active.  Give your body time to rest between periods of activity.  Make sure to use equipment that fits you.  Be safe and responsible while being active to avoid falls.  Do at least 150 minutes of moderate-intensity exercise each week, such as brisk walking or water aerobics.  Maintain physical fitness, including: ? Strength. ? Flexibility. ? Cardiovascular fitness. ? Endurance. Contact a health care provider if:  Your pain does not improve.  Your pain gets worse. Get help right away if:  Your pain is severe.  You cannot move your wrist. This information is not intended to replace advice given to you by your health care provider. Make sure you discuss any questions you have with your health care provider. Document Released: 10/08/2005 Document Revised: 06/12/2016 Document Reviewed: 06/17/2015 Elsevier Interactive Patient Education  2017 [employer].

## 2017-11-14 LAB — URIC ACID: URIC ACID: 10.7 mg/dL — AB (ref 3.7–8.6)

## 2017-11-21 ENCOUNTER — Ambulatory Visit (INDEPENDENT_AMBULATORY_CARE_PROVIDER_SITE_OTHER): Payer: BLUE CROSS/BLUE SHIELD

## 2017-11-21 ENCOUNTER — Other Ambulatory Visit: Payer: Self-pay

## 2017-11-21 ENCOUNTER — Encounter: Payer: Self-pay | Admitting: Family Medicine

## 2017-11-21 ENCOUNTER — Ambulatory Visit (INDEPENDENT_AMBULATORY_CARE_PROVIDER_SITE_OTHER): Payer: BLUE CROSS/BLUE SHIELD | Admitting: Family Medicine

## 2017-11-21 VITALS — BP 121/77 | HR 74 | Temp 99.6°F | Resp 16 | Ht 69.0 in | Wt 241.2 lb

## 2017-11-21 DIAGNOSIS — Z131 Encounter for screening for diabetes mellitus: Secondary | ICD-10-CM

## 2017-11-21 DIAGNOSIS — M791 Myalgia, unspecified site: Secondary | ICD-10-CM

## 2017-11-21 DIAGNOSIS — M25522 Pain in left elbow: Secondary | ICD-10-CM

## 2017-11-21 DIAGNOSIS — M109 Gout, unspecified: Secondary | ICD-10-CM

## 2017-11-21 DIAGNOSIS — M25532 Pain in left wrist: Secondary | ICD-10-CM | POA: Diagnosis not present

## 2017-11-21 LAB — GLUCOSE, POCT (MANUAL RESULT ENTRY): POC Glucose: 88 mg/dl (ref 70–99)

## 2017-11-21 MED ORDER — PREDNISONE 20 MG PO TABS: 40.0000 mg | ORAL_TABLET | Freq: Every day | ORAL | 0 refills | Status: DC

## 2017-11-21 NOTE — Patient Instructions (Addendum)
Wrist pain may still be de to gout versus tendinopathy. Based on last uric acid level, would consider starting allopurinol in 3-4 weeks (after current pain improves). I will check muscle enzyme and repeat sed rate. Ok to continue brace as needed and relative rest for now, and 5 more days of prednisone. If not improving into next week, would recommend ortho eval. Return to the clinic or go to the nearest emergency room if any of your symptoms worsen or new symptoms occur.   IF you received an x-ray today, you will receive an invoice from [facility]. Please contact [facility] at [phone] with questions or concerns regarding your invoice.   IF you received labwork today, you will receive an invoice from [facility]. Please contact [facility] at [phone] with questions or concerns regarding your invoice.   Our billing staff will not be able to assist you with questions regarding bills from these companies.  You will be contacted with the lab results as soon as they are available. The fastest way to get your results is to activate your My Chart account. Instructions are located on the last page of this paperwork. If you have not heard from [place] regarding the results in 2 weeks, please contact this office.

## 2017-11-21 NOTE — Progress Notes (Signed)
Subjective:    Patient ID: Jacob Trevino, male    DOB: 1990-11-24, 27 y.o.   MRN: [id number]  HPI Jacob Trevino is a 27 y.o. male Presents today for: Chief Complaint  Patient presents with  . Wrist Pain    patient presents for follow up on L wrist, states that he continues to have residual soreness. Patient also states that he has some slight issues with L elbow   Here for follow-up of left wrist pain, also with left elbow discomfort.initially seen January 23 by Dr. Brigitte Pulse. Noted left wrist pain 2 days prior. History of gout, but no prior upper extremity flares. Previous gout flare thought to be in ankle 06/2017. Tried meloxicam without relief. Progressed to left forearm discomfort.  Had reported long-standing episodic sensitivity of olecranon area of elbow. No known injury.  Initial exam indicated pain over ulnar side of wrist to fourth and fifth metacarpals, guarded range of motion, especially with extension. Did note some ulnar-sided wrist swelling. X-ray without any acute bony abnormalities. Mild soft tissue swelling over distal ulna. CBC normal, sedimentation rate 30.  Uric acid 10.7. 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 Place in wrist splint, started prednisone taper, relative rest/activity modification.  Does remember small tinge of pain with turning steering wheel with left arm day prior to onset of pain. No other known injury. Does not feel like he has been working more than usual or any change in amount of typing.   Finished prednisone taper 3 days ago (60, 60, 40, 40, 20, 20). Min sweats with prednisone, no fever. Does not feel sick/systemic sx's.   No daily meds for gout recently.   Initially was resting, attempted partial shift working with me 2 days ago. Noted pain after a few hours of typing. Had slight improvement over past week - unable to make a fist last week, able to make fist easier now, but still feels less strong.  No numbness no rash.  Still sore at elbow.   Patient Active Problem List   Diagnosis Date Noted  . Gout 01/28/2017  . ANKLE PAIN, LEFT 07/28/2008   No past medical history on file. Past Surgical History:  Procedure Laterality Date  . PRE-MALIGNANT / BENIGN SKIN LESION EXCISION Left 01/25/2017   Left Foot   No Known Allergies Prior to Admission medications   Medication Sig Start Date End Date Taking? Authorizing Provider  meloxicam (MOBIC) 7.5 MG tablet Take 1-2 tablets (7.5-15 mg total) by mouth daily. Starting after prednisone is complete 11/13/17  Yes Shawnee Knapp, MD  predniSONE (DELTASONE) 20 MG tablet Take 3 tabs po qam x 2d, 2 tabs po qam x 2d, 1 tab po qam x 2d, then stop Patient not taking: Reported on 11/21/2017 11/13/17   Shawnee Knapp, MD   Social History   Socioeconomic History  . Marital status: Single    Spouse name: Not on file  . Number of children: Not on file  . Years of education: Not on file  . Highest education level: Not on file  Social Needs  . Financial resource strain: Not on file  . Food insecurity - worry: Not on file  . Food insecurity - inability: Not on file  . Transportation needs - medical: Not on file  . Transportation needs - non-medical: Not on file  Occupational History  . Not on file  Tobacco Use  . Smoking status: Never Smoker  . Smokeless tobacco: Never Used  Substance and Sexual Activity  .  Alcohol use: Not on file  . Drug use: Not on file  . Sexual activity: Not on file  Other Topics Concern  . Not on file  Social History Narrative  . Not on file    Review of Systems  Constitutional: Negative for chills and fever.  Musculoskeletal: Positive for arthralgias, joint swelling and myalgias.  Skin: Negative for color change, rash and wound.       Objective:   Physical Exam  Constitutional: He is oriented to person, place, and time. He appears well-developed and well-nourished. No distress.  HENT:  Head: Normocephalic and atraumatic.    Pulmonary/Chest: Effort normal.  Musculoskeletal:       Left elbow: He exhibits normal range of motion, no swelling, no effusion and no deformity. Tenderness found. Olecranon process tenderness noted.       Left wrist: He exhibits decreased range of motion, tenderness, bony tenderness and swelling.       Left forearm: He exhibits tenderness (ttp over soft muscle dorsal forearm. ).       Arms: Neurological: He is alert and oriented to person, place, and time.  Skin: Skin is warm and dry. No rash noted. No erythema.  Psychiatric: He has a normal mood and affect. His behavior is normal.   Vitals:   11/21/17 0837  BP: 121/77  Pulse: 74  Resp: 16  Temp: 99.6 F (37.6 C)  TempSrc: Oral  SpO2: 98%  Weight: 241 lb 3.2 oz (109.4 kg)  Height: 5\' 9"  (1.753 m)     Dg Elbow Complete Left (3+view)  Result Date: 11/21/2017 CLINICAL DATA:  Left elbow pain.  History of gout. EXAM: LEFT ELBOW - COMPLETE 3+ VIEW COMPARISON:  No prior. FINDINGS: No acute soft tissue bony abnormality identified. No evidence of erosive arthropathy. No prominent tophi noted. IMPRESSION: Negative exam.  No evidence of significant arthropathy. Electronically Signed   By: Marcello Moores  Register   On: 11/21/2017 09:31       Assessment & Plan:   Jacob Trevino is a 27 y.o. male Left wrist pain - Plan: predniSONE (DELTASONE) 20 MG tablet Gout of left wrist, unspecified cause, unspecified chronicity - Plan: Sedimentation Rate, predniSONE (DELTASONE) 20 MG tablet Myalgia, Arthralgia of left forearm - Plan: CK, Sedimentation Rate  - Acute onset suggests more likely gout flare. Proximal symptoms may have component of tendinitis as well. Less likely TFCC.   - Extend prednisone for 5 additional days, continue relative rest, bracing is needed, repeat sedimentation rate.  -Uric acid once symptoms have resolved.  -Check CPK, but unlikely rhabdo.   -Consider orthostatic hand eval if persistent  Left elbow pain - Plan: DG ELBOW  COMPLETE LEFT (3+VIEW)  -No concerning findings on imaging. Minimal tenderness over olecranon bursa without apparent swelling. RTC precautions if worsening.  Screening for diabetes mellitus - Plan: POCT glucose (manual entry) for repeat prednisone okay   Meds ordered this encounter  Medications  . predniSONE (DELTASONE) 20 MG tablet    Sig: Take 2 tablets (40 mg total) by mouth daily with breakfast. For 3 days, then 1 pill per day for 4 days.    Dispense:  10 tablet    Refill:  0   Patient Instructions   Wrist pain may still be de to gout versus tendinopathy. Based on last uric acid level, would consider starting allopurinol in 3-4 weeks (after current pain improves). I will check muscle enzyme and repeat sed rate. Ok to continue brace as needed and relative rest for now,  and 5 more days of prednisone. If not improving into next week, would recommend ortho eval. Return to the clinic or go to the nearest emergency room if any of your symptoms worsen or new symptoms occur.   IF you received an x-ray today, you will receive an invoice from [facility]. Please contact [facility] at [phone] with questions or concerns regarding your invoice.   IF you received labwork today, you will receive an invoice from [facility]. Please contact [facility] at [phone] with questions or concerns regarding your invoice.   Our billing staff will not be able to assist you with questions regarding bills from these companies.  You will be contacted with the lab results as soon as they are available. The fastest way to get your results is to activate your My Chart account. Instructions are located on the last page of this paperwork. If you have not heard from [place] regarding the results in 2 weeks, please contact this office.      I personally performed the services described in this documentation, which was scribed in my presence. The recorded information has been reviewed and considered for  accuracy and completeness, addended by me as needed, and agree with information above.  Signed,   Merri Ray, MD [facility].  11/23/17 11:20 AM

## 2017-11-22 LAB — CK: Total CK: 49 U/L (ref 24–204)

## 2017-11-22 LAB — SEDIMENTATION RATE: Sed Rate: 65 mm/hr — ABNORMAL HIGH (ref 0–15)

## 2017-11-23 ENCOUNTER — Encounter: Payer: Self-pay | Admitting: Family Medicine

## 2017-11-27 ENCOUNTER — Telehealth: Payer: Self-pay

## 2017-11-27 NOTE — Telephone Encounter (Signed)
-----   Message from Wendie Agreste, MD sent at 11/27/2017  2:35 PM EST ----- Please call Jacob Trevino.  Check status. Muscle enzyme test was normal. Inflammation test was still elevated, but that can happen with gout. If symptoms are not improving would recommend repeating that test along with repeat office visit. Let me know how he is doing.

## 2017-11-27 NOTE — Progress Notes (Signed)
Called and left VM and advised him to call back to let us know how he is doing and to give results.

## 2017-11-27 NOTE — Telephone Encounter (Signed)
Left pt a VM regarding results and advised pt to let us know how he is doing.

## 2017-12-06 ENCOUNTER — Telehealth: Payer: Self-pay | Admitting: Family Medicine

## 2017-12-06 DIAGNOSIS — M25522 Pain in left elbow: Secondary | ICD-10-CM

## 2017-12-06 DIAGNOSIS — M25532 Pain in left wrist: Secondary | ICD-10-CM

## 2017-12-06 NOTE — Telephone Encounter (Signed)
Discussed labs. Improved - 85% better. Still some soreness at outside of outside of wrist and on tip of elbow. Would like to meet with hand specialist. No fever, not worsening. Able to work using the wrist brace.   Will refer to hand specialist for further eval, RTC if worsening.

## 2017-12-10 ENCOUNTER — Other Ambulatory Visit: Payer: Self-pay | Admitting: Family Medicine

## 2017-12-10 DIAGNOSIS — M65839 Other synovitis and tenosynovitis, unspecified forearm: Secondary | ICD-10-CM

## 2017-12-10 DIAGNOSIS — Z8739 Personal history of other diseases of the musculoskeletal system and connective tissue: Secondary | ICD-10-CM

## 2017-12-10 DIAGNOSIS — M25532 Pain in left wrist: Secondary | ICD-10-CM

## 2017-12-10 NOTE — Telephone Encounter (Signed)
Refill of Mobic  LOV   11/21/17  LRF 11/13/17    #60   0 refills  [id number] - [place], [place] [address]

## 2017-12-20 DIAGNOSIS — M1A0320 Idiopathic chronic gout, left wrist, without tophus (tophi): Secondary | ICD-10-CM | POA: Diagnosis not present

## 2017-12-20 DIAGNOSIS — R52 Pain, unspecified: Secondary | ICD-10-CM | POA: Diagnosis not present

## 2018-04-04 DIAGNOSIS — M109 Gout, unspecified: Secondary | ICD-10-CM | POA: Diagnosis not present

## 2018-04-04 DIAGNOSIS — Z6835 Body mass index (BMI) 35.0-35.9, adult: Secondary | ICD-10-CM | POA: Diagnosis not present

## 2018-05-14 ENCOUNTER — Telehealth: Payer: Self-pay | Admitting: Family Medicine

## 2018-05-14 DIAGNOSIS — M109 Gout, unspecified: Secondary | ICD-10-CM

## 2018-05-14 DIAGNOSIS — M25532 Pain in left wrist: Secondary | ICD-10-CM

## 2018-05-14 MED ORDER — PREDNISONE 50 MG PO TABS: 50.0000 mg | ORAL_TABLET | Freq: Every day | ORAL | 0 refills | Status: AC

## 2018-05-14 NOTE — Telephone Encounter (Signed)
Developed gout flair in right elbow radiating distally along extensor surface.

## 2018-07-07 DIAGNOSIS — M109 Gout, unspecified: Secondary | ICD-10-CM | POA: Diagnosis not present

## 2018-07-07 DIAGNOSIS — Z0000 Encounter for general adult medical examination without abnormal findings: Secondary | ICD-10-CM | POA: Diagnosis not present

## 2018-07-14 DIAGNOSIS — Z1389 Encounter for screening for other disorder: Secondary | ICD-10-CM | POA: Diagnosis not present

## 2018-07-14 DIAGNOSIS — Z23 Encounter for immunization: Secondary | ICD-10-CM | POA: Diagnosis not present

## 2018-07-14 DIAGNOSIS — M109 Gout, unspecified: Secondary | ICD-10-CM | POA: Diagnosis not present

## 2018-07-14 DIAGNOSIS — Z0000 Encounter for general adult medical examination without abnormal findings: Secondary | ICD-10-CM | POA: Diagnosis not present

## 2018-07-14 DIAGNOSIS — E781 Pure hyperglyceridemia: Secondary | ICD-10-CM | POA: Diagnosis not present

## 2018-07-14 DIAGNOSIS — R740 Nonspecific elevation of levels of transaminase and lactic acid dehydrogenase [LDH]: Secondary | ICD-10-CM | POA: Diagnosis not present

## 2018-08-05 DIAGNOSIS — M25561 Pain in right knee: Secondary | ICD-10-CM | POA: Diagnosis not present

## 2018-08-07 DIAGNOSIS — M25561 Pain in right knee: Secondary | ICD-10-CM | POA: Diagnosis not present

## 2018-08-08 DIAGNOSIS — M25561 Pain in right knee: Secondary | ICD-10-CM | POA: Diagnosis not present

## 2018-08-28 DIAGNOSIS — E781 Pure hyperglyceridemia: Secondary | ICD-10-CM | POA: Diagnosis not present

## 2018-08-28 DIAGNOSIS — R740 Nonspecific elevation of levels of transaminase and lactic acid dehydrogenase [LDH]: Secondary | ICD-10-CM | POA: Diagnosis not present

## 2018-08-28 DIAGNOSIS — M109 Gout, unspecified: Secondary | ICD-10-CM | POA: Diagnosis not present

## 2018-12-18 IMAGING — DX DG ELBOW COMPLETE 3+V*L*
4 series · 4 of 4 positions shown · non-contrast
Comparison: No prior.

CLINICAL DATA: Left elbow pain.  History of gout.

EXAM:
LEFT ELBOW - COMPLETE 3+ VIEW

[elbow ap]
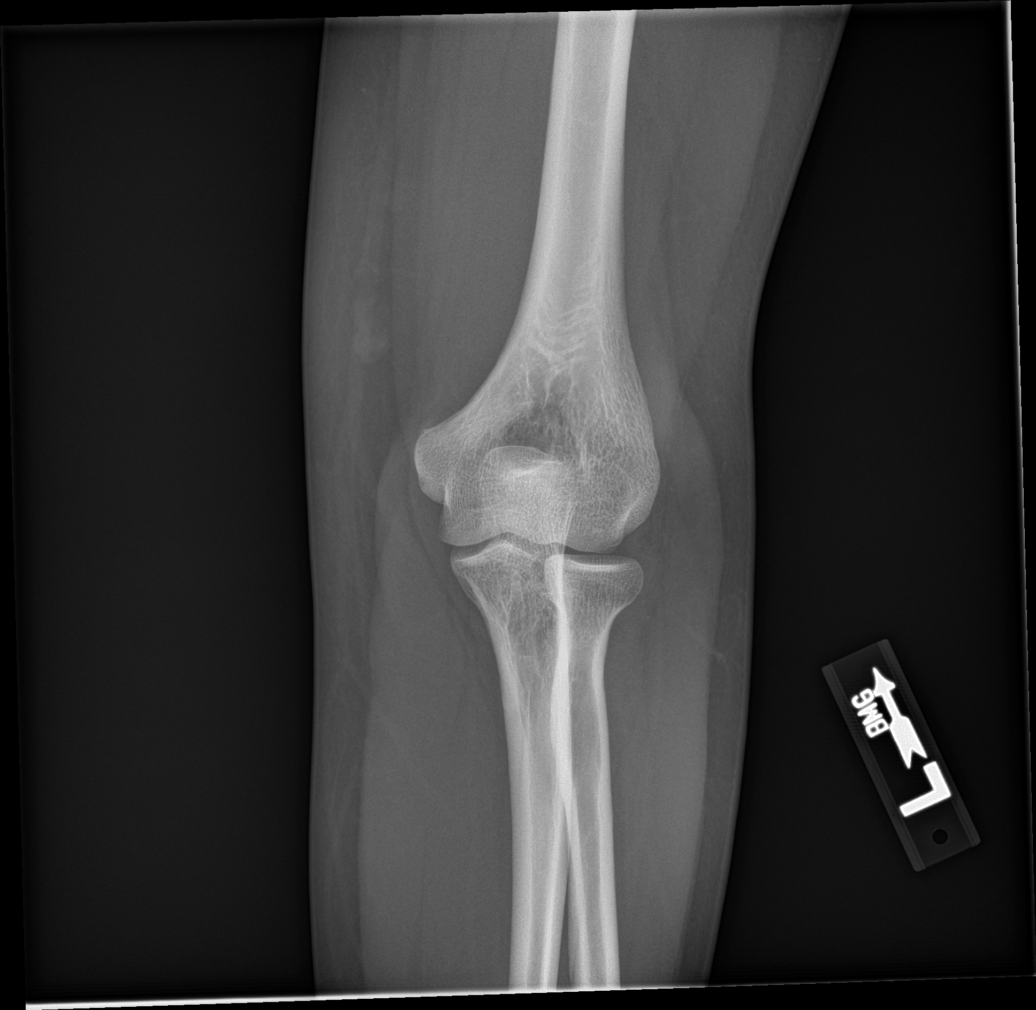

[elbow obl (1 of 2)]
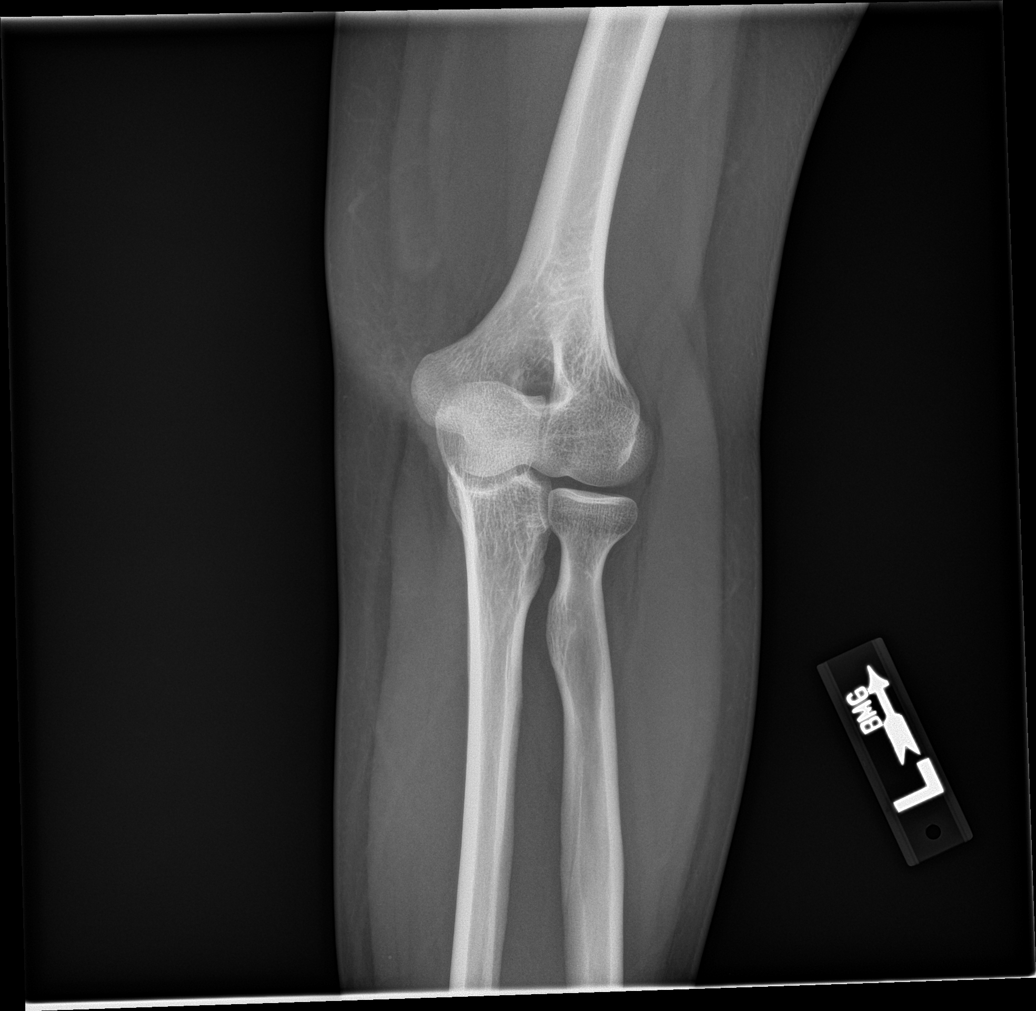

[elbow obl (2 of 2)]
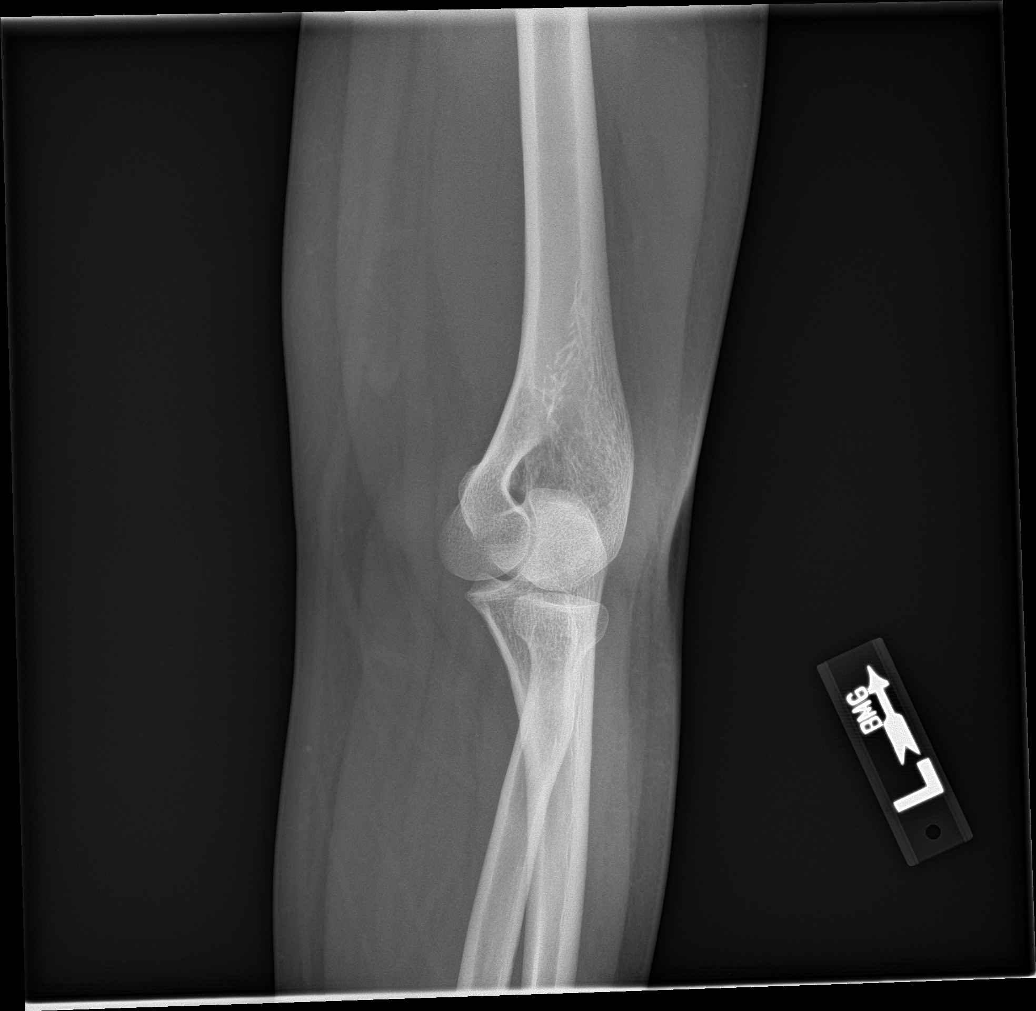

[elbow lat]
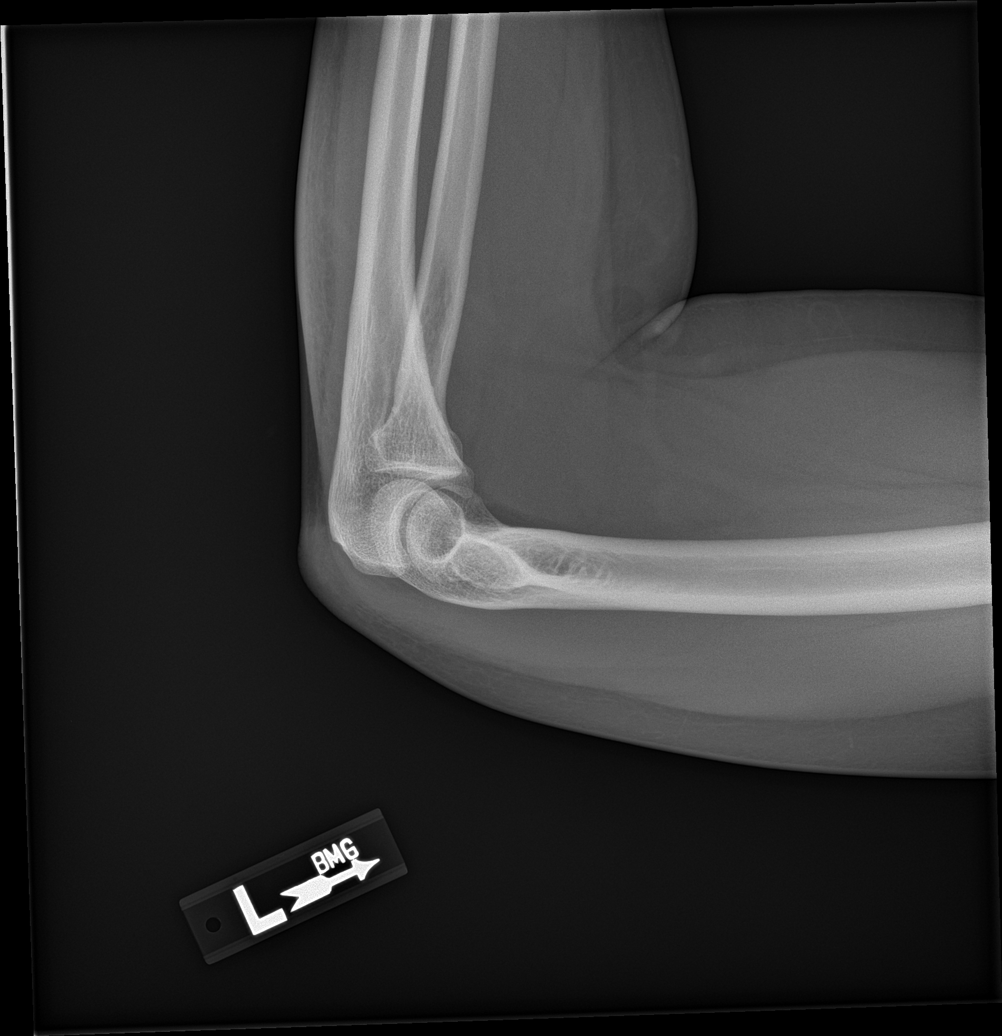

[4 of 4 positions shown; findings below may reference images not displayed]

FINDINGS: No acute soft tissue bony abnormality identified. No evidence of
erosive arthropathy. No prominent tophi noted.
IMPRESSION: Negative exam.  No evidence of significant arthropathy.

## 2019-07-23 DIAGNOSIS — Z0001 Encounter for general adult medical examination with abnormal findings: Secondary | ICD-10-CM | POA: Diagnosis not present

## 2019-07-23 DIAGNOSIS — E7800 Pure hypercholesterolemia, unspecified: Secondary | ICD-10-CM | POA: Diagnosis not present

## 2019-07-23 DIAGNOSIS — M109 Gout, unspecified: Secondary | ICD-10-CM | POA: Diagnosis not present

## 2019-07-29 DIAGNOSIS — Z1331 Encounter for screening for depression: Secondary | ICD-10-CM | POA: Diagnosis not present

## 2019-07-29 DIAGNOSIS — Z0000 Encounter for general adult medical examination without abnormal findings: Secondary | ICD-10-CM | POA: Diagnosis not present

## 2019-07-29 DIAGNOSIS — R7401 Elevation of levels of liver transaminase levels: Secondary | ICD-10-CM | POA: Diagnosis not present

## 2019-07-29 DIAGNOSIS — M109 Gout, unspecified: Secondary | ICD-10-CM | POA: Diagnosis not present

## 2019-07-29 DIAGNOSIS — E781 Pure hyperglyceridemia: Secondary | ICD-10-CM | POA: Diagnosis not present

## 2019-07-31 DIAGNOSIS — Z23 Encounter for immunization: Secondary | ICD-10-CM | POA: Diagnosis not present
